# Patient Record
Sex: Female | Born: 1985 | Race: White | Hispanic: No | Smoking: Current every day smoker
Health system: Southern US, Community
[De-identification: ages and names within clinical notes are randomized; demographics above are authoritative.]

## PROBLEM LIST (undated history)

## (undated) DIAGNOSIS — J45909 Unspecified asthma, uncomplicated: Secondary | ICD-10-CM

## (undated) DIAGNOSIS — I10 Essential (primary) hypertension: Secondary | ICD-10-CM

## (undated) HISTORY — PX: TONSILLECTOMY: SUR1361

## (undated) HISTORY — PX: MANDIBLE FRACTURE SURGERY: SHX706

---
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
## 2004-11-06 ENCOUNTER — Emergency Department: Payer: Self-pay | Admitting: Emergency Medicine

## 2004-11-10 ENCOUNTER — Ambulatory Visit: Payer: Self-pay | Admitting: Obstetrics & Gynecology

## 2005-06-06 ENCOUNTER — Emergency Department: Payer: Self-pay | Admitting: Emergency Medicine

## 2005-06-26 ENCOUNTER — Emergency Department: Payer: Self-pay | Admitting: General Practice

## 2005-06-27 ENCOUNTER — Ambulatory Visit: Payer: Self-pay | Admitting: Emergency Medicine

## 2005-09-18 ENCOUNTER — Observation Stay: Payer: Self-pay

## 2006-01-24 ENCOUNTER — Observation Stay: Payer: Self-pay | Admitting: Obstetrics and Gynecology

## 2006-02-02 ENCOUNTER — Inpatient Hospital Stay: Payer: Self-pay | Admitting: Obstetrics and Gynecology

## 2006-03-17 ENCOUNTER — Emergency Department: Payer: Self-pay | Admitting: Emergency Medicine

## 2006-08-19 ENCOUNTER — Emergency Department: Payer: Self-pay | Admitting: Emergency Medicine

## 2007-02-10 ENCOUNTER — Encounter: Payer: Self-pay | Admitting: Maternal & Fetal Medicine

## 2007-03-21 ENCOUNTER — Observation Stay: Payer: Self-pay | Admitting: Obstetrics and Gynecology

## 2007-04-05 ENCOUNTER — Observation Stay: Payer: Self-pay

## 2007-04-07 ENCOUNTER — Ambulatory Visit: Payer: Self-pay | Admitting: Obstetrics and Gynecology

## 2007-04-08 ENCOUNTER — Inpatient Hospital Stay: Payer: Self-pay | Admitting: Obstetrics and Gynecology

## 2007-05-18 ENCOUNTER — Emergency Department: Payer: Self-pay | Admitting: Emergency Medicine

## 2007-05-23 ENCOUNTER — Emergency Department: Payer: Self-pay | Admitting: Emergency Medicine

## 2007-08-18 ENCOUNTER — Emergency Department: Payer: Self-pay | Admitting: Emergency Medicine

## 2007-09-24 ENCOUNTER — Emergency Department: Payer: Self-pay | Admitting: Unknown Physician Specialty

## 2008-08-21 ENCOUNTER — Emergency Department: Payer: Self-pay | Admitting: Internal Medicine

## 2009-03-02 ENCOUNTER — Emergency Department: Payer: Self-pay | Admitting: Unknown Physician Specialty

## 2009-07-12 ENCOUNTER — Emergency Department: Payer: Self-pay | Admitting: Emergency Medicine

## 2010-04-26 ENCOUNTER — Emergency Department: Payer: Self-pay | Admitting: Emergency Medicine

## 2010-07-10 ENCOUNTER — Encounter: Payer: Self-pay | Admitting: Maternal & Fetal Medicine

## 2010-07-29 ENCOUNTER — Observation Stay: Payer: Self-pay | Admitting: Obstetrics and Gynecology

## 2010-12-14 ENCOUNTER — Emergency Department: Payer: Self-pay | Admitting: Internal Medicine

## 2014-03-15 ENCOUNTER — Emergency Department: Payer: Self-pay | Admitting: Emergency Medicine

## 2014-03-15 LAB — URINALYSIS, COMPLETE
BILIRUBIN, UR: NEGATIVE
Blood: NEGATIVE
GLUCOSE, UR: NEGATIVE mg/dL (ref 0–75)
Leukocyte Esterase: NEGATIVE
Nitrite: NEGATIVE
PH: 6 (ref 4.5–8.0)
Protein: NEGATIVE
RBC,UR: <1 /HPF (ref 0–5)
Specific Gravity: 1.018 (ref 1.003–1.030)
Squamous Epithelial: 2
WBC UR: 1 /HPF (ref 0–5)

## 2014-03-15 LAB — COMPREHENSIVE METABOLIC PANEL
ALK PHOS: 56 U/L
Albumin: 3.9 g/dL (ref 3.4–5.0)
Anion Gap: 6 — ABNORMAL LOW (ref 7–16)
BILIRUBIN TOTAL: 0.4 mg/dL (ref 0.2–1.0)
BUN: 10 mg/dL (ref 7–18)
CHLORIDE: 107 mmol/L (ref 98–107)
CREATININE: 0.72 mg/dL (ref 0.60–1.30)
Calcium, Total: 8.8 mg/dL (ref 8.5–10.1)
Co2: 25 mmol/L (ref 21–32)
GLUCOSE: 73 mg/dL (ref 65–99)
OSMOLALITY: 273 (ref 275–301)
Potassium: 3.7 mmol/L (ref 3.5–5.1)
SGOT(AST): 25 U/L (ref 15–37)
SGPT (ALT): 14 U/L (ref 12–78)
Sodium: 138 mmol/L (ref 136–145)
Total Protein: 7.2 g/dL (ref 6.4–8.2)

## 2014-03-15 LAB — CBC WITH DIFFERENTIAL/PLATELET
Basophil #: 0.1 10*3/uL (ref 0.0–0.1)
Basophil %: 0.8 %
EOS PCT: 1.1 %
Eosinophil #: 0.1 10*3/uL (ref 0.0–0.7)
HCT: 40.6 % (ref 35.0–47.0)
HGB: 13.9 g/dL (ref 12.0–16.0)
Lymphocyte #: 1.9 10*3/uL (ref 1.0–3.6)
Lymphocyte %: 25 %
MCH: 31.9 pg (ref 26.0–34.0)
MCHC: 34.2 g/dL (ref 32.0–36.0)
MCV: 94 fL (ref 80–100)
Monocyte #: 0.6 x10 3/mm (ref 0.2–0.9)
Monocyte %: 8 %
NEUTROS PCT: 65.1 %
Neutrophil #: 4.8 10*3/uL (ref 1.4–6.5)
Platelet: 174 10*3/uL (ref 150–440)
RBC: 4.35 10*6/uL (ref 3.80–5.20)
RDW: 13 % (ref 11.5–14.5)
WBC: 7.4 10*3/uL (ref 3.6–11.0)

## 2014-03-15 LAB — LIPASE, BLOOD: Lipase: 124 U/L (ref 73–393)

## 2014-03-22 ENCOUNTER — Ambulatory Visit: Payer: Self-pay | Admitting: Gastroenterology

## 2014-03-29 ENCOUNTER — Emergency Department: Payer: Self-pay | Admitting: Emergency Medicine

## 2014-03-29 LAB — COMPREHENSIVE METABOLIC PANEL
ALK PHOS: 50 U/L
AST: 23 U/L (ref 15–37)
Albumin: 3.7 g/dL (ref 3.4–5.0)
Anion Gap: 2 — ABNORMAL LOW (ref 7–16)
BUN: 8 mg/dL (ref 7–18)
Bilirubin,Total: 0.4 mg/dL (ref 0.2–1.0)
Calcium, Total: 8.5 mg/dL (ref 8.5–10.1)
Chloride: 110 mmol/L — ABNORMAL HIGH (ref 98–107)
Co2: 27 mmol/L (ref 21–32)
Creatinine: 0.74 mg/dL (ref 0.60–1.30)
Glucose: 87 mg/dL (ref 65–99)
Osmolality: 275 (ref 275–301)
POTASSIUM: 3.9 mmol/L (ref 3.5–5.1)
SGPT (ALT): 16 U/L (ref 12–78)
Sodium: 139 mmol/L (ref 136–145)
Total Protein: 6.8 g/dL (ref 6.4–8.2)

## 2014-03-29 LAB — URINALYSIS, COMPLETE
BACTERIA: NONE SEEN
BILIRUBIN, UR: NEGATIVE
Glucose,UR: NEGATIVE mg/dL (ref 0–75)
Ketone: NEGATIVE
LEUKOCYTE ESTERASE: NEGATIVE
NITRITE: NEGATIVE
Ph: 6 (ref 4.5–8.0)
Protein: NEGATIVE
SPECIFIC GRAVITY: 1.016 (ref 1.003–1.030)
Squamous Epithelial: 1
WBC UR: NONE SEEN /HPF (ref 0–5)

## 2014-03-29 LAB — CBC WITH DIFFERENTIAL/PLATELET
Basophil #: 0 10*3/uL (ref 0.0–0.1)
Basophil %: 0.6 %
EOS PCT: 1.4 %
Eosinophil #: 0.1 10*3/uL (ref 0.0–0.7)
HCT: 42.3 % (ref 35.0–47.0)
HGB: 14.1 g/dL (ref 12.0–16.0)
Lymphocyte #: 1.7 10*3/uL (ref 1.0–3.6)
Lymphocyte %: 26.8 %
MCH: 31.5 pg (ref 26.0–34.0)
MCHC: 33.4 g/dL (ref 32.0–36.0)
MCV: 94 fL (ref 80–100)
MONO ABS: 0.4 x10 3/mm (ref 0.2–0.9)
Monocyte %: 6.5 %
NEUTROS ABS: 4 10*3/uL (ref 1.4–6.5)
Neutrophil %: 64.7 %
Platelet: 138 10*3/uL — ABNORMAL LOW (ref 150–440)
RBC: 4.48 10*6/uL (ref 3.80–5.20)
RDW: 13.6 % (ref 11.5–14.5)
WBC: 6.2 10*3/uL (ref 3.6–11.0)

## 2014-09-01 ENCOUNTER — Emergency Department: Payer: Self-pay | Admitting: Emergency Medicine

## 2015-03-19 ENCOUNTER — Emergency Department: Payer: Self-pay

## 2015-04-23 NOTE — Consult Note (Signed)
Brief Consult Note: Diagnosis: suspect anal fissure.   Patient was seen by consultant.   Consult note dictated.   Recommend to proceed with surgery or procedure.   Discussed with Attending MD.   Comments: Rev'd Drs. Michela PitcherEly and OmnicomWohl's notes suggesting anal fissure which is consistant with pt's history. Pt never filled anusol HC supp Rx. Wants to have surgery that Dr Michela PitcherEly had suggested. She will call office in am.  Electronic Signatures: Lattie Hawooper, Guled Gahan E (MD)  (Signed 30-Mar-15 21:11)  Authored: Brief Consult Note   Last Updated: 30-Mar-15 21:11 by Lattie Hawooper, Jasara Corrigan E (MD)
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
## 2016-01-16 ENCOUNTER — Emergency Department: Payer: Self-pay

## 2016-01-16 ENCOUNTER — Encounter: Payer: Self-pay | Admitting: Urgent Care

## 2016-01-16 ENCOUNTER — Emergency Department
Admission: EM | Admit: 2016-01-16 | Discharge: 2016-01-16 | Disposition: A | Discharge status: Home / Self Care | Payer: Self-pay | Attending: Emergency Medicine | Admitting: Emergency Medicine

## 2016-01-16 DIAGNOSIS — Y998 Other external cause status: Secondary | ICD-10-CM | POA: Insufficient documentation

## 2016-01-16 DIAGNOSIS — Y9389 Activity, other specified: Secondary | ICD-10-CM | POA: Insufficient documentation

## 2016-01-16 DIAGNOSIS — S20212A Contusion of left front wall of thorax, initial encounter: Secondary | ICD-10-CM | POA: Insufficient documentation

## 2016-01-16 DIAGNOSIS — Y9289 Other specified places as the place of occurrence of the external cause: Secondary | ICD-10-CM | POA: Insufficient documentation

## 2016-01-16 HISTORY — DX: Unspecified asthma, uncomplicated: J45.909

## 2016-01-16 MED ORDER — CYCLOBENZAPRINE HCL 5 MG PO TABS: 5.0000 mg | ORAL_TABLET | Freq: Three times a day (TID) | ORAL | Status: DC | PRN

## 2016-01-16 MED ORDER — IBUPROFEN 800 MG PO TABS
800.0000 mg | ORAL_TABLET | Freq: Once | ORAL | Status: AC
  Administered 2016-01-16: 800 mg via ORAL
  Filled 2016-01-16: qty 1

## 2016-01-16 MED ORDER — IBUPROFEN 800 MG PO TABS: 800.0000 mg | ORAL_TABLET | Freq: Three times a day (TID) | ORAL | Status: DC | PRN
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
## 2016-01-16 NOTE — ED Notes (Signed)
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
## 2016-01-16 NOTE — ED Notes (Signed)
Patient transported to X-ray via wheelchair 

## 2016-01-16 NOTE — ED Notes (Signed)
Pt requesting "is there something I can get, like a breathing treatment, I have severe asthma." No increased work in breathing noted to patient. PA notified.
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
## 2016-01-16 NOTE — Discharge Instructions (Signed)
Rib Contusion A rib contusion is a deep bruise on your rib area. Contusions are the result of a blunt trauma that causes bleeding and injury to the tissues under the skin. A rib contusion may involve bruising of the ribs and of the skin and muscles in the area. The skin overlying the contusion may turn blue, purple, or yellow. Minor injuries will give you a painless contusion, but more severe contusions may stay painful and swollen for a few weeks. CAUSES  A contusion is usually caused by a blow, trauma, or direct force to an area of the body. This often occurs while playing contact sports. SYMPTOMS  Swelling and redness of the injured area.  Discoloration of the injured area.  Tenderness and soreness of the injured area.  Pain with or without movement. DIAGNOSIS  The diagnosis can be made by taking a medical history and performing a physical exam. An X-ray, CT scan, or MRI may be needed to determine if there were any associated injuries, such as broken bones (fractures) or internal injuries. TREATMENT  Often, the best treatment for a rib contusion is rest. Icing or applying cold compresses to the injured area may help reduce swelling and inflammation. Deep breathing exercises may be recommended to reduce the risk of partial lung collapse and pneumonia. Over-the-counter or prescription medicines may also be recommended for pain control. HOME CARE INSTRUCTIONS   Apply ice to the injured area:  Put ice in a plastic bag.  Place a towel between your skin and the bag.  Leave the ice on for 20 minutes, 2-3 times per day.  Take medicines only as directed by your health care provider.  Rest the injured area. Avoid strenuous activity and any activities or movements that cause pain. Be careful during activities and avoid bumping the injured area.  Perform deep-breathing exercises as directed by your health care provider.  Do not lift anything that is heavier than 5 lb (2.3 kg) until your  health care provider approves.  Do not use any tobacco products, including cigarettes, chewing tobacco, or electronic cigarettes. If you need help quitting, ask your health care provider. SEEK MEDICAL CARE IF:   You have increased bruising or swelling.  You have pain that is not controlled with treatment.  You have a fever. SEEK IMMEDIATE MEDICAL CARE IF:   You have difficulty breathing or shortness of breath.  You develop a continual cough, or you cough up thick or bloody sputum.  You feel sick to your stomach (nauseous), you throw up (vomit), or you have abdominal pain.   This information is not intended to replace advice given to you by your health care provider. Make sure you discuss any questions you have with your health care provider.   Document Released: 09/11/2001 Document Revised: 01/07/2015 Document Reviewed: 09/28/2014 Elsevier Interactive Patient Education 2016 [employer]  General Assault Assault includes any behavior or physical attack--whether it is on purpose or not--that results in injury to another person, damage to property, or both. This also includes assault that has not yet happened, but is planned to happen. Threats of assault may be physical, verbal, or written. They may be said or sent by:  Mail.  E-mail.  Text.  Social media.  Fax. The threats may be direct, implied, or understood. WHAT ARE THE DIFFERENT FORMS OF ASSAULT? Forms of assault include:  Physically assaulting a person. This includes physical threats to inflict physical harm as well as:  Slapping.  Hitting.  Poking.  Kicking.  Punching.  Pushing.  Sexually assaulting a person. Sexual assault is any sexual activity that a person is forced, threatened, or coerced to participate in. It may or may not involve physical contact with the person who is assaulting you. You are sexually assaulted if you are forced to have sexual contact of any kind.  Damaging or destroying a  person's assistive equipment, such as glasses, canes, or walkers.  Throwing or hitting objects.  Using or displaying a weapon to harm or threaten someone.  Using or displaying an object that appears to be a weapon in a threatening manner.  Using greater physical size or strength to intimidate someone.  Making intimidating or threatening gestures.  Bullying.  Hazing.  Using language that is intimidating, threatening, hostile, or abusive.  Stalking.  Restraining someone with force. WHAT SHOULD I DO IF I EXPERIENCE ASSAULT?  Report assaults, threats, and stalking to the police. Call your local emergency services (911 in the [place] if you are in immediate danger or you need medical help.  You can work with a Clinical research associate or an advocate to get legal protection against someone who has assaulted you or threatened you with assault. Protection includes restraining orders and private addresses. Crimes against you, such as assault, can also be prosecuted through the courts. Laws will vary depending on where you live.   This information is not intended to replace advice given to you by your health care provider. Make sure you discuss any questions you have with your health care provider.   Document Released: 12/17/2005 Document Revised: 01/07/2015 Document Reviewed: 09/03/2014 Elsevier Interactive Patient Education [employer]  Your x-ray did not reveal any rib fractures. Take the prescription meds as directed. Apply ice to the ribs for relief of pain. Follow-up with [facility] for ongoing management.

## 2016-04-12 ENCOUNTER — Emergency Department
Admission: EM | Admit: 2016-04-12 | Discharge: 2016-04-13 | Disposition: A | Discharge status: Home / Self Care | Payer: Self-pay | Attending: Emergency Medicine | Admitting: Emergency Medicine

## 2016-04-12 ENCOUNTER — Emergency Department: Payer: Self-pay

## 2016-04-12 ENCOUNTER — Encounter: Payer: Self-pay | Admitting: Emergency Medicine

## 2016-04-12 DIAGNOSIS — Z5321 Procedure and treatment not carried out due to patient leaving prior to being seen by health care provider: Secondary | ICD-10-CM | POA: Insufficient documentation

## 2016-04-12 DIAGNOSIS — F17210 Nicotine dependence, cigarettes, uncomplicated: Secondary | ICD-10-CM | POA: Insufficient documentation

## 2016-04-12 NOTE — ED Notes (Signed)
Pt continues to talk on phone about "this place is terrible, we don't care about her"; xray tech out to pt for CXR, pt cont to talk on phone, stating that she doesn't want to be seen and wants to leave; xray tech explains to pt purpose of test and pt agrees to go
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
## 2016-04-12 NOTE — ED Notes (Signed)
Pt called several times to be taken immediately back to a room; talking on phone in lobby and will not respond or hang up phone when asked
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
## 2016-04-12 NOTE — ED Notes (Signed)
Pt sitting in lobby in w/c talking loudly to other patients and visitors who move away from her; pt with slurred speech noted, no distress
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
## 2016-04-13 ENCOUNTER — Telehealth: Payer: Self-pay | Admitting: Emergency Medicine

## 2016-04-13 NOTE — ED Notes (Signed)
Pt not found in lobby 

## 2016-08-04 ENCOUNTER — Emergency Department: Payer: Medicaid Other

## 2016-08-04 ENCOUNTER — Emergency Department
Admission: EM | Admit: 2016-08-04 | Discharge: 2016-08-04 | Disposition: A | Discharge status: Home / Self Care | Payer: Medicaid Other | Attending: Emergency Medicine | Admitting: Emergency Medicine

## 2016-08-04 ENCOUNTER — Encounter: Payer: Self-pay | Admitting: Emergency Medicine

## 2016-08-04 DIAGNOSIS — F17210 Nicotine dependence, cigarettes, uncomplicated: Secondary | ICD-10-CM | POA: Diagnosis not present

## 2016-08-04 DIAGNOSIS — S0993XA Unspecified injury of face, initial encounter: Secondary | ICD-10-CM | POA: Diagnosis present

## 2016-08-04 DIAGNOSIS — S02602A Fracture of unspecified part of body of left mandible, initial encounter for closed fracture: Secondary | ICD-10-CM | POA: Insufficient documentation

## 2016-08-04 DIAGNOSIS — R51 Headache: Secondary | ICD-10-CM | POA: Diagnosis not present

## 2016-08-04 DIAGNOSIS — Y939 Activity, unspecified: Secondary | ICD-10-CM | POA: Insufficient documentation

## 2016-08-04 DIAGNOSIS — Y999 Unspecified external cause status: Secondary | ICD-10-CM | POA: Diagnosis not present

## 2016-08-04 DIAGNOSIS — S02609A Fracture of mandible, unspecified, initial encounter for closed fracture: Secondary | ICD-10-CM

## 2016-08-04 DIAGNOSIS — J45909 Unspecified asthma, uncomplicated: Secondary | ICD-10-CM | POA: Diagnosis not present

## 2016-08-04 DIAGNOSIS — R0789 Other chest pain: Secondary | ICD-10-CM | POA: Insufficient documentation

## 2016-08-04 DIAGNOSIS — Y929 Unspecified place or not applicable: Secondary | ICD-10-CM | POA: Diagnosis not present

## 2016-08-04 MED ORDER — HYDROMORPHONE HCL 2 MG PO TABS: 2.0000 mg | ORAL_TABLET | Freq: Two times a day (BID) | ORAL | 0 refills | Status: DC | PRN

## 2016-08-04 MED ORDER — LIDOCAINE 5 % EX PTCH
1.0000 | MEDICATED_PATCH | CUTANEOUS | Status: DC
  Administered 2016-08-04: 1 via TRANSDERMAL
  Filled 2016-08-04: qty 1

## 2016-08-04 MED ORDER — LIDOCAINE 5 % EX PTCH: 1.0000 | MEDICATED_PATCH | CUTANEOUS | 0 refills | Status: DC

## 2016-08-04 MED ORDER — CLINDAMYCIN HCL 300 MG PO CAPS
300.0000 mg | ORAL_CAPSULE | Freq: Three times a day (TID) | ORAL | 0 refills | Status: AC
Start: 2016-08-04 — End: 2016-08-14

## 2016-08-04 MED ORDER — ONDANSETRON 4 MG PO TBDP
4.0000 mg | ORAL_TABLET | Freq: Once | ORAL | Status: AC
  Administered 2016-08-04: 4 mg via ORAL

## 2016-08-04 MED ORDER — ONDANSETRON 4 MG PO TBDP: 4.0000 mg | ORAL_TABLET | Freq: Three times a day (TID) | ORAL | 0 refills | Status: DC | PRN

## 2016-08-04 MED ORDER — HYDROMORPHONE HCL 1 MG/ML IJ SOLN
1.0000 mg | Freq: Once | INTRAMUSCULAR | Status: AC
  Administered 2016-08-04: 1 mg via INTRAMUSCULAR
  Filled 2016-08-04: qty 1

## 2016-08-04 MED ORDER — ONDANSETRON 4 MG PO TBDP
ORAL_TABLET | ORAL | Status: AC
  Administered 2016-08-04: 4 mg via ORAL
  Filled 2016-08-04: qty 1

## 2016-08-04 MED ORDER — ONDANSETRON 4 MG PO TBDP
4.0000 mg | ORAL_TABLET | Freq: Once | ORAL | Status: AC
  Administered 2016-08-04: 4 mg via ORAL
  Filled 2016-08-04: qty 1

## 2016-08-04 NOTE — ED Notes (Signed)
SANE at bedside to take pictures. Will apply lidocaine patch after pictures are taken.

## 2016-08-04 NOTE — ED Notes (Signed)
SANE nurse at bedside.
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
## 2016-08-04 NOTE — ED Notes (Signed)
SANE still at bedside

## 2016-08-04 NOTE — ED Notes (Signed)
Pt feeling sick while taking pictures, more zofran given with verbal orders.

## 2016-08-04 NOTE — ED Notes (Signed)
MD at bedside. 
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
## 2016-08-04 NOTE — ED Notes (Signed)
Patient transported to CT 
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
## 2016-08-04 NOTE — ED Notes (Signed)
CSW at bedside.

## 2016-08-04 NOTE — ED Notes (Signed)
Patient transported to X-ray 
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
## 2016-08-25 NOTE — SANE Note (Signed)
Addendum:  Safety education provided to patient upon discharge, patient verbalized understanding of all instructions.

## 2017-01-29 ENCOUNTER — Encounter: Payer: Self-pay | Admitting: Emergency Medicine

## 2017-01-29 ENCOUNTER — Emergency Department
Admission: EM | Admit: 2017-01-29 | Discharge: 2017-01-30 | Disposition: A | Discharge status: Home / Self Care | Payer: Medicaid Other | Attending: Emergency Medicine | Admitting: Emergency Medicine

## 2017-01-29 DIAGNOSIS — F10120 Alcohol abuse with intoxication, uncomplicated: Secondary | ICD-10-CM | POA: Diagnosis not present

## 2017-01-29 DIAGNOSIS — F17210 Nicotine dependence, cigarettes, uncomplicated: Secondary | ICD-10-CM | POA: Insufficient documentation

## 2017-01-29 DIAGNOSIS — F10920 Alcohol use, unspecified with intoxication, uncomplicated: Secondary | ICD-10-CM

## 2017-01-29 DIAGNOSIS — R55 Syncope and collapse: Secondary | ICD-10-CM | POA: Insufficient documentation

## 2017-01-29 DIAGNOSIS — J45909 Unspecified asthma, uncomplicated: Secondary | ICD-10-CM | POA: Diagnosis not present

## 2017-01-29 DIAGNOSIS — Z79899 Other long term (current) drug therapy: Secondary | ICD-10-CM | POA: Insufficient documentation

## 2017-01-29 DIAGNOSIS — I10 Essential (primary) hypertension: Secondary | ICD-10-CM | POA: Diagnosis not present

## 2017-01-29 HISTORY — DX: Essential (primary) hypertension: I10

## 2017-01-29 LAB — CBC
HEMATOCRIT: 40.2 % (ref 35.0–47.0)
Hemoglobin: 13.7 g/dL (ref 12.0–16.0)
MCH: 32.3 pg (ref 26.0–34.0)
MCHC: 34 g/dL (ref 32.0–36.0)
MCV: 94.8 fL (ref 80.0–100.0)
PLATELETS: 189 10*3/uL (ref 150–440)
RBC: 4.24 MIL/uL (ref 3.80–5.20)
RDW: 13.2 % (ref 11.5–14.5)
WBC: 6.1 10*3/uL (ref 3.6–11.0)

## 2017-01-29 MED ORDER — SODIUM CHLORIDE 0.9 % IV BOLUS (SEPSIS)
1000.0000 mL | Freq: Once | INTRAVENOUS | Status: AC
  Administered 2017-01-29: 1000 mL via INTRAVENOUS
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
## 2017-01-30 ENCOUNTER — Emergency Department: Payer: Medicaid Other

## 2017-01-30 LAB — URINE DRUG SCREEN, QUALITATIVE (ARMC ONLY)
Amphetamines, Ur Screen: NOT DETECTED
BARBITURATES, UR SCREEN: NOT DETECTED
Benzodiazepine, Ur Scrn: NOT DETECTED
COCAINE METABOLITE, UR ~~LOC~~: NOT DETECTED
Cannabinoid 50 Ng, Ur ~~LOC~~: NOT DETECTED
MDMA (Ecstasy)Ur Screen: NOT DETECTED
METHADONE SCREEN, URINE: NOT DETECTED
OPIATE, UR SCREEN: NOT DETECTED
Phencyclidine (PCP) Ur S: NOT DETECTED
Tricyclic, Ur Screen: NOT DETECTED

## 2017-01-30 LAB — COMPREHENSIVE METABOLIC PANEL
ALK PHOS: 56 U/L (ref 38–126)
ALT: 15 U/L (ref 14–54)
ANION GAP: 7 (ref 5–15)
AST: 28 U/L (ref 15–41)
Albumin: 3.8 g/dL (ref 3.5–5.0)
BILIRUBIN TOTAL: 0.5 mg/dL (ref 0.3–1.2)
BUN: 6 mg/dL (ref 6–20)
CALCIUM: 8.6 mg/dL — AB (ref 8.9–10.3)
CO2: 24 mmol/L (ref 22–32)
Chloride: 112 mmol/L — ABNORMAL HIGH (ref 101–111)
Creatinine, Ser: 0.63 mg/dL (ref 0.44–1.00)
GLUCOSE: 94 mg/dL (ref 65–99)
Potassium: 3.4 mmol/L — ABNORMAL LOW (ref 3.5–5.1)
Sodium: 143 mmol/L (ref 135–145)
TOTAL PROTEIN: 7 g/dL (ref 6.5–8.1)

## 2017-01-30 LAB — SALICYLATE LEVEL: Salicylate Lvl: <7 mg/dL (ref 2.8–30.0)

## 2017-01-30 LAB — ETHANOL: ALCOHOL ETHYL (B): 331 mg/dL — AB (ref <5)

## 2017-01-30 LAB — TROPONIN I: Troponin I: <0.03 ng/mL (ref <0.03)

## 2017-01-30 LAB — POC URINE PREG, ED

## 2017-01-30 LAB — ACETAMINOPHEN LEVEL: Acetaminophen (Tylenol), Serum: <10 ug/mL — ABNORMAL LOW (ref 10–30)

## 2017-01-30 MED ORDER — ZIPRASIDONE MESYLATE 20 MG IM SOLR
INTRAMUSCULAR | Status: AC
  Filled 2017-01-30: qty 20

## 2017-01-30 MED ORDER — LORAZEPAM 2 MG/ML IJ SOLN
INTRAMUSCULAR | Status: AC
  Filled 2017-01-30: qty 1

## 2017-01-30 MED ORDER — LORAZEPAM 2 MG/ML IJ SOLN
2.0000 mg | Freq: Once | INTRAMUSCULAR | Status: AC
  Administered 2017-01-30: 2 mg via INTRAVENOUS

## 2017-01-30 NOTE — ED Notes (Signed)
POC preg negative.

## 2017-01-30 NOTE — ED Notes (Signed)
Pt pulled out IV and was stated she was leaving. Pt became combative and argumentative.

## 2017-01-30 NOTE — ED Provider Notes (Signed)
[facility] Emergency Department Provider Note   ____________________________________________   First MD Initiated Contact with Patient 01/29/17 2327     (approximate)  I have reviewed the triage vital signs and the nursing notes.   HISTORY  Chief Complaint Alcohol Intoxication and Loss of Consciousness  Patient intoxicated and is unable to provide reliable history  HPI Elizabeth Santos is a 31 y.o. female who was brought in by ambulance with a syncopal event. The patient reports that she does not know why she is here but she came by ambulance. She was drinking today and she is not sure how much. She reports that she has some abdominal cramps and is on her menstrual cycle. According to EMS the patient was treated with some friends and family. She passed out and her sister thought she might not up in breathing. The patient was breathing but they did contact EMS. The patient did hit her head when she fell. They're unable to say how long she was unconscious but they know she was drinking significantly.   Past Medical History:  Diagnosis Date  . Asthma   . Hypertension     There are no active problems to display for this patient.   Past Surgical History:  Procedure Laterality Date  . CESAREAN SECTION    . MANDIBLE FRACTURE SURGERY      Prior to Admission medications   Medication Sig Start Date End Date Taking? Authorizing Provider  cyclobenzaprine (FLEXERIL) 5 MG tablet Take 1 tablet (5 mg total) by mouth 3 (three) times daily as needed for muscle spasms. 01/16/16   Jenise V Bacon Menshew, PA-C  HYDROmorphone (DILAUDID) 2 MG tablet Take 1 tablet (2 mg total) by mouth every 12 (twelve) hours as needed for severe pain. 08/04/16   Jennye Moccasin, MD  ibuprofen (ADVIL,MOTRIN) 800 MG tablet Take 1 tablet (800 mg total) by mouth every 8 (eight) hours as needed. 01/16/16   Jenise V Bacon Menshew, PA-C  lidocaine (LIDODERM) 5 % Place 1 patch onto the skin daily.  08/04/16   Jennye Moccasin, MD  ondansetron (ZOFRAN ODT) 4 MG disintegrating tablet Take 1 tablet (4 mg total) by mouth every 8 (eight) hours as needed for nausea or vomiting. 08/04/16   Jennye Moccasin, MD    Allergies Toradol [ketorolac tromethamine]; Tramadol; and Vicodin [hydrocodone-acetaminophen]  History reviewed. No pertinent family history.  Social History Social History  Substance Use Topics  . Smoking status: Current Every Day Smoker    Packs/day: 1.50    Types: Cigarettes  . Smokeless tobacco: Never Used  . Alcohol use Yes     Comment: "as much as I can get"    Review of Systems Constitutional: No fever/chills Eyes: No visual changes. ENT: No sore throat. Cardiovascular: Denies chest pain. Respiratory: Denies shortness of breath. Gastrointestinal: abdominal pain.  No nausea, no vomiting.  No diarrhea.  No constipation. Genitourinary: Negative for dysuria. Musculoskeletal: Negative for back pain. Skin: Negative for rash. Neurological: Syncope  10-point ROS otherwise negative.  ____________________________________________   PHYSICAL EXAM:  VITAL SIGNS: ED Triage Vitals  Enc Vitals Group     BP 01/29/17 2313 (!) 139/95     Pulse Rate 01/29/17 2313 99     Resp 01/29/17 2313 18     Temp 01/29/17 2313 98.4 F (36.9 C)     Temp Source 01/29/17 2313 Oral     SpO2 01/29/17 2313 95 %     Weight 01/29/17 2316 176 lb (  79.8 kg)     Height 01/29/17 2316 5\' 7"  (1.702 m)     Head Circumference --      Peak Flow --      Pain Score 01/29/17 2317 7     Pain Loc --      Pain Edu? --      Excl. in GC? --     Constitutional: Alert and oriented. Intoxicated appearing and in no distress. Eyes: Conjunctivae are normal. PERRL. EOMI. Head: Atraumatic. Nose: No congestion/rhinnorhea. Mouth/Throat: Mucous membranes are moist.  Oropharynx non-erythematous. Neck: No cervical spine tenderness to palpation. Cardiovascular: Normal rate, regular rhythm. Grossly normal heart  sounds.  Good peripheral circulation. Respiratory: Normal respiratory effort.  No retractions. Lungs CTAB. Gastrointestinal: Soft and nontender. No distention. Positive bowel sounds Musculoskeletal: No lower extremity tenderness nor edema.   Neurologic:  Slurring speech, cranial nerves II through XII grossly intact. Strength intact throughout, sensation intact throughout Skin:  Skin is warm, dry and intact.  Psychiatric: Mood and affect are normal.   ____________________________________________   LABS (all labs ordered are listed, but only abnormal results are displayed)  Labs Reviewed  COMPREHENSIVE METABOLIC PANEL - Abnormal; Notable for the following:       Result Value   Potassium 3.4 (*)    Chloride 112 (*)    Calcium 8.6 (*)    All other components within normal limits  ETHANOL - Abnormal; Notable for the following:    Alcohol, Ethyl (B) 331 (*)    All other components within normal limits  ACETAMINOPHEN LEVEL - Abnormal; Notable for the following:    Acetaminophen (Tylenol), Serum <10 (*)    All other components within normal limits  POC URINE PREG, ED - Normal  CBC  TROPONIN I  SALICYLATE LEVEL  URINE DRUG SCREEN, QUALITATIVE ([facility] ONLY)   ____________________________________________  EKG  ED ECG REPORT I, Rebecka Apley, the attending physician, personally viewed and interpreted this ECG.   Date: 01/30/2017  EKG Time: 150  Rate: 65  Rhythm: normal sinus rhythm  Axis: normal  Intervals:none  ST&T Change: none  ____________________________________________  RADIOLOGY  CT head and cervical spine ____________________________________________   PROCEDURES  Procedure(s) performed: None  Procedures  Critical Care performed: No  ____________________________________________   INITIAL IMPRESSION / ASSESSMENT AND PLAN / ED COURSE  Pertinent labs & imaging results that were available during my care of the patient were reviewed by me and considered  in my medical decision making (see chart for details).  This is a 31 year old female who comes into the hospital today with alcohol intoxication. The patient was drinking and passed out. I did check some blood work and it was found that the patient's alcohol is 331. The patient did have a CT scan of her head and cervical spine and that was unremarkable. Prior to having all of the results of her blood work and her imaging the patient became agitated. Her sister was in the room and they were arguing. The patient did receive 2 mg of Ativan to help with her agitation. The patient reports that she wants to go home. The patient's father is here with her. After receiving the results of the patient's blood work I discussed the patient going home with her father. He reports that he is willing to take responsibility for her and he is willing to take her home. The patient did receive a liter of normal saline and she is now calm and apologetic for her behavior. The patient will  be discharged home. I feel the patient's syncope was due to her acute and severe alcohol intoxication. She has not vomited in the emergency department. She'll be discharged home.  Clinical Course as of Jan 30 214  Wed Jan 30, 2017  0145 1. No acute intracranial abnormality. 2. No acute fracture or subluxation of the cervical spine.   CT Head Wo Contrast [AW]    Clinical Course User Index [AW] Rebecka ApleyAllison P Jeziel Hoffmann, MD     ____________________________________________   FINAL CLINICAL IMPRESSION(S) / ED DIAGNOSES  Final diagnoses:  Acute alcoholic intoxication without complication (HCC)  Syncope, unspecified syncope type      NEW MEDICATIONS STARTED DURING THIS VISIT:  New Prescriptions   No medications on file     Note:  This document was prepared using Dragon voice recognition software and may include unintentional dictation errors.    Rebecka ApleyAllison P Lahari Suttles, MD 01/30/17 [phone]

## 2017-01-30 NOTE — ED Notes (Addendum)
Critical ETOH 331 MD made aware.

## 2017-08-13 ENCOUNTER — Emergency Department: Payer: Medicaid Other

## 2017-08-13 ENCOUNTER — Emergency Department
Admission: EM | Admit: 2017-08-13 | Discharge: 2017-08-13 | Disposition: A | Discharge status: Home / Self Care | Payer: Medicaid Other | Attending: Emergency Medicine | Admitting: Emergency Medicine

## 2017-08-13 DIAGNOSIS — Y929 Unspecified place or not applicable: Secondary | ICD-10-CM | POA: Diagnosis not present

## 2017-08-13 DIAGNOSIS — S0240CA Maxillary fracture, right side, initial encounter for closed fracture: Secondary | ICD-10-CM | POA: Diagnosis not present

## 2017-08-13 DIAGNOSIS — F10929 Alcohol use, unspecified with intoxication, unspecified: Secondary | ICD-10-CM | POA: Insufficient documentation

## 2017-08-13 DIAGNOSIS — S025XXA Fracture of tooth (traumatic), initial encounter for closed fracture: Secondary | ICD-10-CM | POA: Insufficient documentation

## 2017-08-13 DIAGNOSIS — Y999 Unspecified external cause status: Secondary | ICD-10-CM | POA: Diagnosis not present

## 2017-08-13 DIAGNOSIS — I10 Essential (primary) hypertension: Secondary | ICD-10-CM | POA: Diagnosis not present

## 2017-08-13 DIAGNOSIS — Y939 Activity, unspecified: Secondary | ICD-10-CM | POA: Insufficient documentation

## 2017-08-13 DIAGNOSIS — X58XXXA Exposure to other specified factors, initial encounter: Secondary | ICD-10-CM | POA: Insufficient documentation

## 2017-08-13 DIAGNOSIS — F17200 Nicotine dependence, unspecified, uncomplicated: Secondary | ICD-10-CM | POA: Insufficient documentation

## 2017-08-13 DIAGNOSIS — S0990XA Unspecified injury of head, initial encounter: Secondary | ICD-10-CM | POA: Diagnosis present

## 2017-08-13 LAB — ETHANOL: Alcohol, Ethyl (B): 222 mg/dL — ABNORMAL HIGH (ref <5)

## 2017-08-13 MED ORDER — MORPHINE SULFATE (PF) 2 MG/ML IV SOLN
2.0000 mg | Freq: Once | INTRAVENOUS | Status: AC
  Administered 2017-08-13: 2 mg via INTRAVENOUS
  Filled 2017-08-13: qty 1

## 2017-08-13 MED ORDER — SODIUM CHLORIDE 0.9 % IV BOLUS (SEPSIS)
1000.0000 mL | Freq: Once | INTRAVENOUS | Status: AC
  Administered 2017-08-13: 1000 mL via INTRAVENOUS

## 2017-08-13 MED ORDER — OXYCODONE-ACETAMINOPHEN 5-325 MG PO TABS: 1.0000 | ORAL_TABLET | ORAL | 0 refills | Status: DC | PRN

## 2017-08-13 MED ORDER — MORPHINE SULFATE (PF) 4 MG/ML IV SOLN
4.0000 mg | Freq: Once | INTRAVENOUS | Status: AC
  Administered 2017-08-13: 4 mg via INTRAVENOUS
  Filled 2017-08-13: qty 1

## 2017-08-13 MED ORDER — ONDANSETRON HCL 4 MG/2ML IJ SOLN
4.0000 mg | Freq: Once | INTRAMUSCULAR | Status: AC
  Administered 2017-08-13: 4 mg via INTRAVENOUS
  Filled 2017-08-13: qty 2

## 2017-08-13 MED ORDER — PIPERACILLIN-TAZOBACTAM 3.375 G IVPB 30 MIN
3.3750 g | Freq: Once | INTRAVENOUS | Status: AC
  Administered 2017-08-13: 3.375 g via INTRAVENOUS
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
## 2020-05-17 ENCOUNTER — Emergency Department

## 2020-05-17 ENCOUNTER — Other Ambulatory Visit: Payer: Self-pay

## 2020-05-17 ENCOUNTER — Encounter: Payer: Self-pay | Admitting: Medical Oncology

## 2020-05-17 ENCOUNTER — Emergency Department
Admission: EM | Admit: 2020-05-17 | Discharge: 2020-05-17 | Disposition: A | Discharge status: Home / Self Care | Attending: Emergency Medicine | Admitting: Emergency Medicine

## 2020-05-17 DIAGNOSIS — Y92199 Unspecified place in other specified residential institution as the place of occurrence of the external cause: Secondary | ICD-10-CM | POA: Diagnosis not present

## 2020-05-17 DIAGNOSIS — Z23 Encounter for immunization: Secondary | ICD-10-CM | POA: Insufficient documentation

## 2020-05-17 DIAGNOSIS — I10 Essential (primary) hypertension: Secondary | ICD-10-CM | POA: Insufficient documentation

## 2020-05-17 DIAGNOSIS — F17210 Nicotine dependence, cigarettes, uncomplicated: Secondary | ICD-10-CM | POA: Diagnosis not present

## 2020-05-17 DIAGNOSIS — Y999 Unspecified external cause status: Secondary | ICD-10-CM | POA: Insufficient documentation

## 2020-05-17 DIAGNOSIS — S0101XA Laceration without foreign body of scalp, initial encounter: Secondary | ICD-10-CM

## 2020-05-17 DIAGNOSIS — Y939 Activity, unspecified: Secondary | ICD-10-CM | POA: Diagnosis not present

## 2020-05-17 DIAGNOSIS — J45909 Unspecified asthma, uncomplicated: Secondary | ICD-10-CM | POA: Insufficient documentation

## 2020-05-17 MED ORDER — LIDOCAINE-EPINEPHRINE 2 %-1:100000 IJ SOLN
20.0000 mL | Freq: Once | INTRAMUSCULAR | Status: AC
  Administered 2020-05-17: 20 mL via INTRADERMAL
  Filled 2020-05-17: qty 1

## 2020-05-17 MED ORDER — TETANUS-DIPHTH-ACELL PERTUSSIS 5-2.5-18.5 LF-MCG/0.5 IM SUSP
0.5000 mL | Freq: Once | INTRAMUSCULAR | Status: AC
  Administered 2020-05-17: 0.5 mL via INTRAMUSCULAR
  Filled 2020-05-17: qty 0.5
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
## 2021-11-18 IMAGING — CT CT HEAD W/O CM
4 series · 16 of 47 positions shown, 18 images · non-contrast
Comparison: CT head/cervical spine 08/13/2017

CLINICAL DATA: Headache, posttraumatic

EXAM:
CT HEAD WITHOUT CONTRAST
CT CERVICAL SPINE WITHOUT CONTRAST
TECHNIQUE: Multidetector CT imaging of the head and cervical spine was
performed following the standard protocol without intravenous
contrast. Multiplanar CT image reconstructions of the cervical spine
were also generated.

[Series 2: head wo · axial · 0.42mm/px · z∈[+315,+435]mm · 7 of 32 slices shown, 9 images]
[im 4/32  brain]
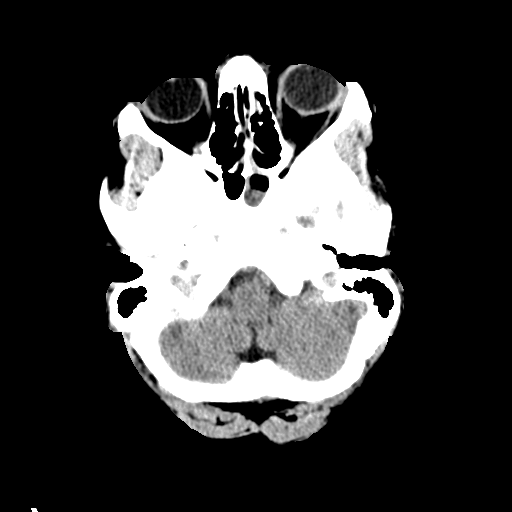
[im 4/32  bone]
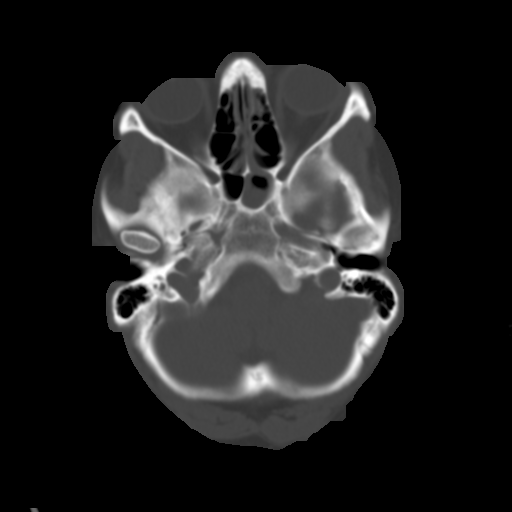
[im 8/32  brain]
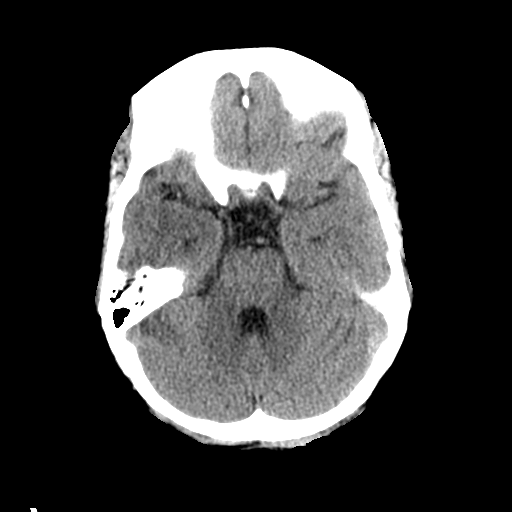
[im 12/32  brain]
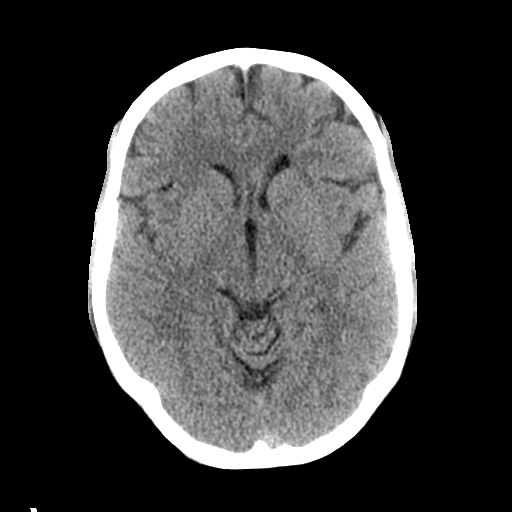
[im 16/32  brain]
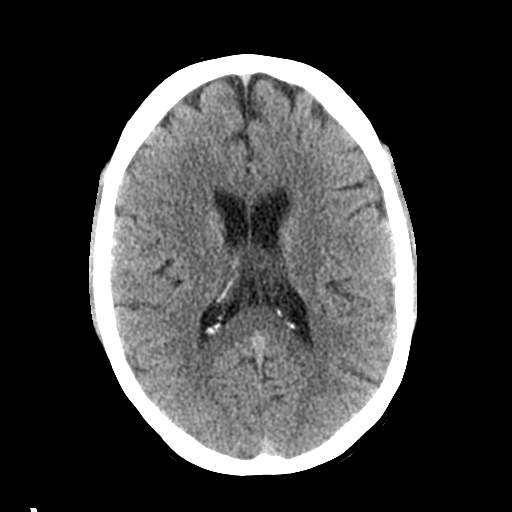
[im 20/32  brain]
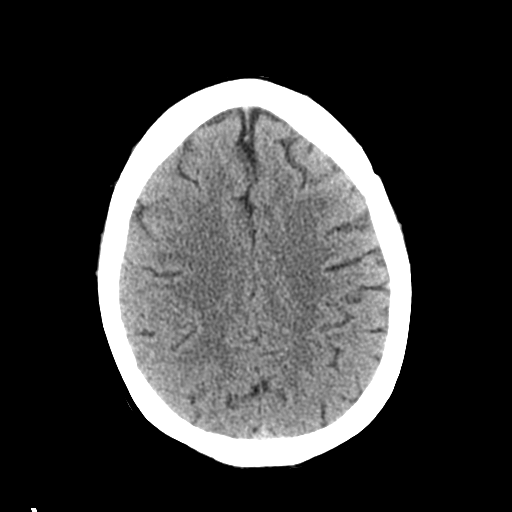
[im 20/32  bone]
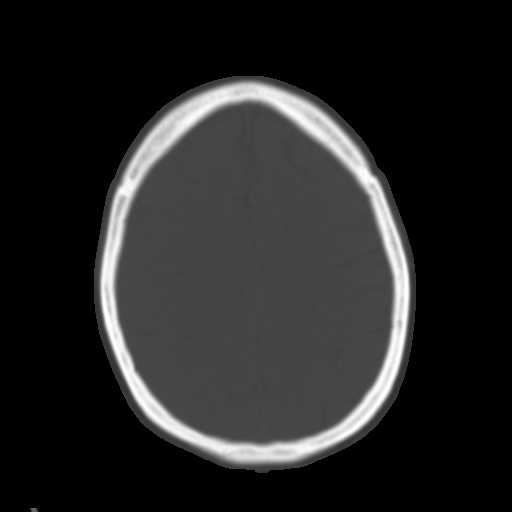
[im 24/32  brain]
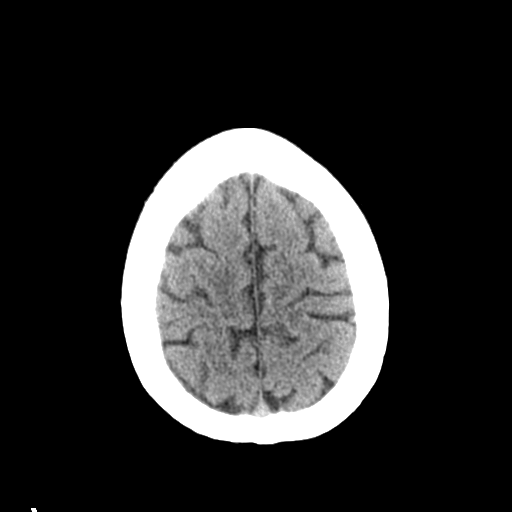
[im 28/32  brain]
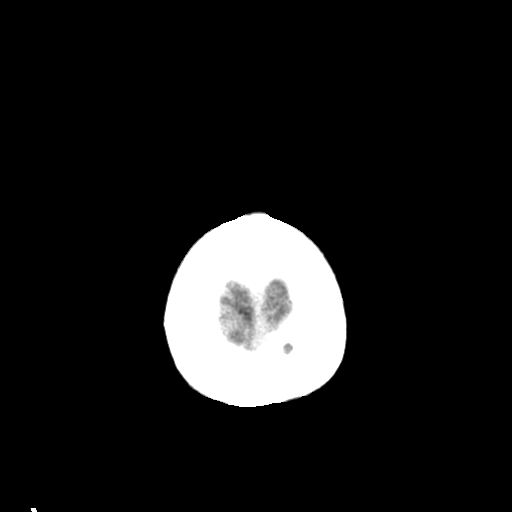

[Series 3: head bone · axial · 0.42mm/px · z∈[+314,+346]mm · 3 of 79 slices shown]
[im 8/79  bone]
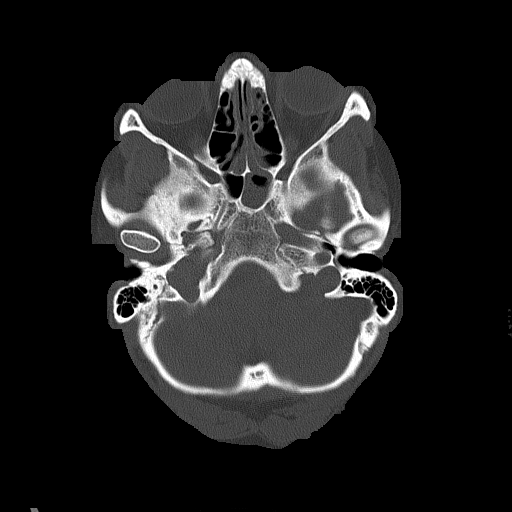
[im 16/79  bone]
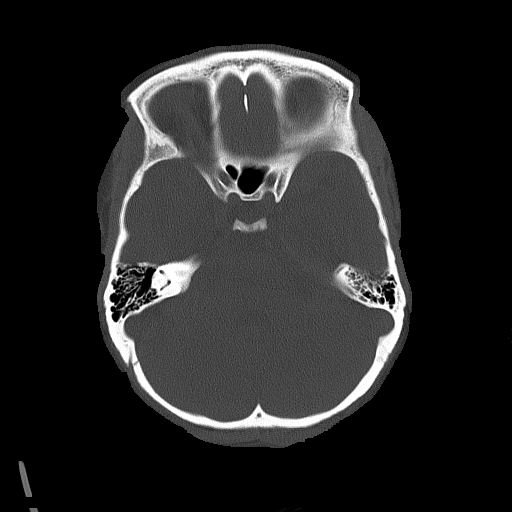
[im 24/79  bone]
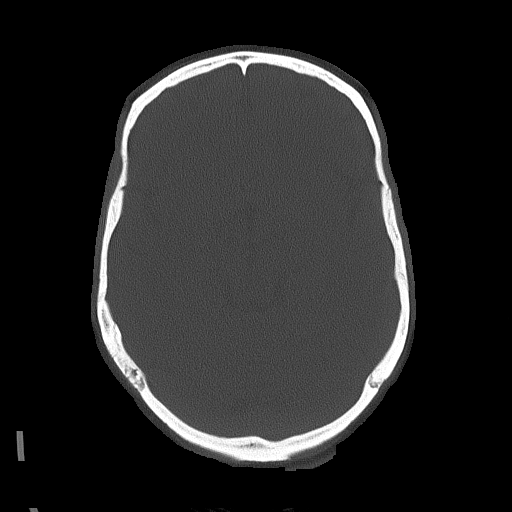

[Series 4: coronal soft tissue · coronal · 0.31mm/px · 3 of 61 slices shown]
[im 21/61  brain]
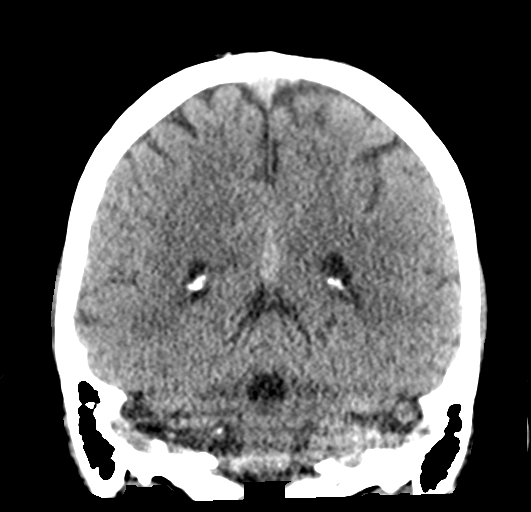
[im 27/61  brain]
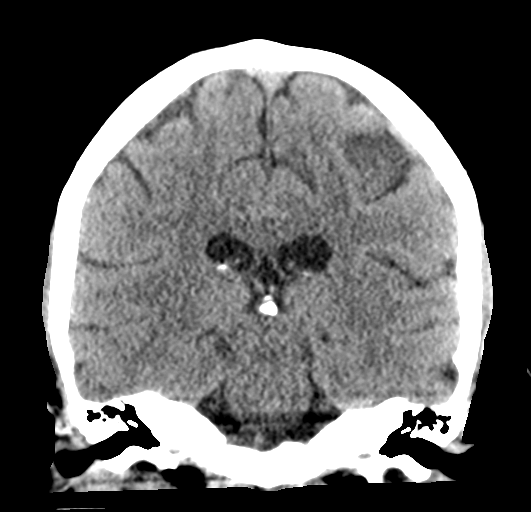
[im 34/61  brain]
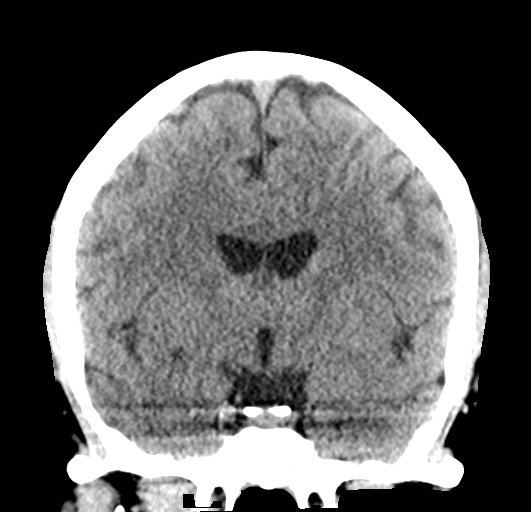

[Series 5: sagittal soft tissue · sagittal · 0.30mm/px · 3 of 49 slices shown]
[im 17/49  brain]
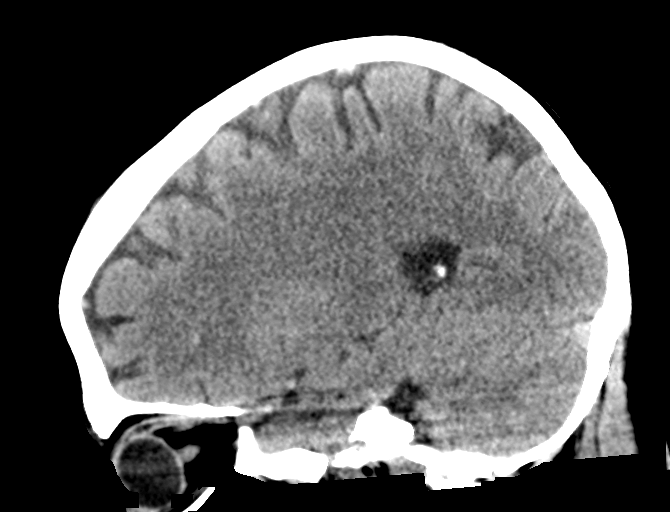
[im 25/49  brain]
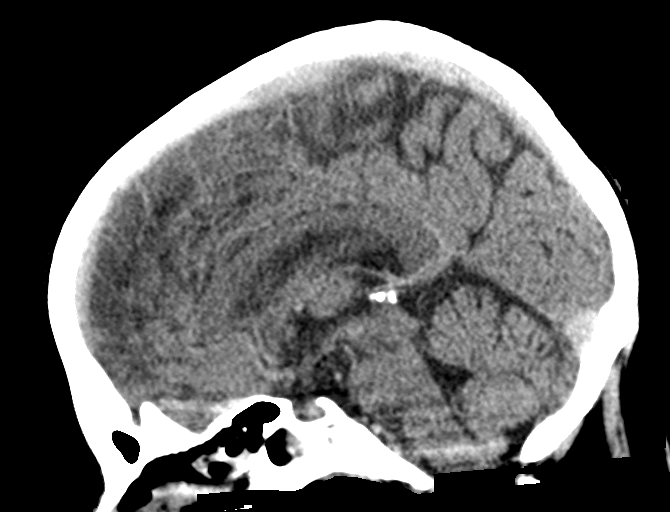
[im 33/49  brain]
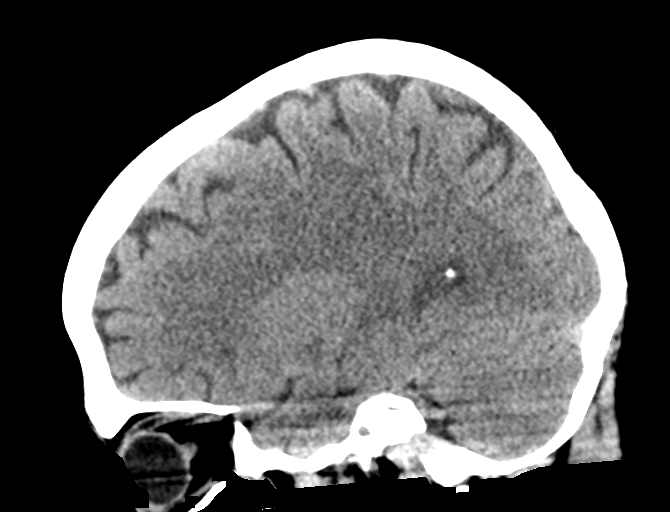

[16 of 47 positions shown; findings below may reference images not displayed]

FINDINGS: CT HEAD FINDINGS

Brain:

There is no acute intracranial hemorrhage.

No demarcated cortical infarct.

No extra-axial fluid collection.

No evidence of intracranial mass.

No midline shift.

Vascular: No hyperdense vessel.

Skull: Normal. Negative for fracture or focal lesion.

Sinuses/Orbits: Visualized orbits show no acute finding. Moderate
ethmoid and left sphenoid sinus mucosal thickening. No significant
mastoid effusion.

Other: Left frontal scalp laceration.

CT CERVICAL SPINE FINDINGS

Alignment: Nonspecific reversal of the expected cervical lordosis.
No significant spondylolisthesis.

Skull base and vertebrae: The basion-dental and atlanto-dental
intervals are maintained.No evidence of acute fracture to the
cervical spine.

Soft tissues and spinal canal: No prevertebral fluid or swelling. No
visible canal hematoma.

Disc levels: No significant bony spinal canal or neural foraminal
narrowing at any level.

Upper chest: No consolidation within the imaged lung apices. No
visible pneumothorax.
IMPRESSION: CT head:

1.  No evidence of acute intracranial abnormality.
2. Left frontal scalp laceration
3. Moderate ethmoid and left sphenoid sinus mucosal thickening.

CT cervical spine:

1. No evidence of acute fracture to the cervical spine.
2. Nonspecific reversal of the expected cervical lordosis.
